# Patient Record
Sex: Male | Born: 1990 | Race: White | Hispanic: No | Marital: Single | Smoking: Never smoker
Health system: Southern US, Community
[De-identification: ages and names within clinical notes are randomized; demographics above are authoritative.]

---

## 2016-04-29 ENCOUNTER — Ambulatory Visit
Admission: RE | Admit: 2016-04-29 | Discharge: 2016-04-29 | Disposition: A | Discharge status: Home / Self Care | Payer: Worker's Compensation | Source: Ambulatory Visit | Attending: Occupational Medicine | Admitting: Occupational Medicine

## 2016-04-29 ENCOUNTER — Other Ambulatory Visit: Payer: Self-pay | Admitting: Occupational Medicine

## 2016-04-29 DIAGNOSIS — S6992XD Unspecified injury of left wrist, hand and finger(s), subsequent encounter: Secondary | ICD-10-CM

## 2016-05-17 ENCOUNTER — Encounter (INDEPENDENT_AMBULATORY_CARE_PROVIDER_SITE_OTHER): Payer: Self-pay | Admitting: Orthopaedic Surgery

## 2016-05-17 ENCOUNTER — Ambulatory Visit (INDEPENDENT_AMBULATORY_CARE_PROVIDER_SITE_OTHER): Payer: Worker's Compensation | Admitting: Orthopaedic Surgery

## 2016-05-17 ENCOUNTER — Ambulatory Visit (INDEPENDENT_AMBULATORY_CARE_PROVIDER_SITE_OTHER): Payer: Self-pay

## 2016-05-17 VITALS — BP 114/71 | HR 90 | Ht 69.0 in | Wt 181.0 lb

## 2016-05-17 DIAGNOSIS — S62337A Displaced fracture of neck of fifth metacarpal bone, left hand, initial encounter for closed fracture: Secondary | ICD-10-CM | POA: Diagnosis not present

## 2016-05-17 DIAGNOSIS — M79642 Pain in left hand: Secondary | ICD-10-CM

## 2016-05-17 NOTE — Progress Notes (Signed)
Office Visit Note   Patient: Sean Bradley           Date of Birth: 10/10/1990           MRN: [id number] Visit Date: 05/17/2016              Requested by: No referring provider defined for this encounter. PCP: No primary care provider on file.   Assessment & Plan: Visit Diagnoses:  1. Pain in left hand           On-the-job injury 04/29/2016 with acute closed left fifth metacarpal neck fracture.  Plan: Reduction was performed with the [facility] brace application. He'll gradually tightness up to improved position. Works of given no work 3 weeks we will recheck him again in 3 weeks. 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  Follow-Up Instructions: No Follow-up on file.   Orders:  Orders Placed This Encounter  Procedures  . XR Hand Complete Left  . XR Hand Complete Left   No orders of the defined types were placed in this encounter.     Procedures: No procedures performed   Clinical Data: No additional findings.   Subjective: Chief Complaint  Patient presents with  . Left Hand - Fracture    Silus is for left minimally displaced metacarpal fracture.  He states that he was at work and a box fell off the box he was trying to get and he went to catch the box and hit his hand.  He is not wearing any type of brace or splint on it today.   Date on-the-job injury is 04/29/2016 he works for "the agency"  Review of Systems  Constitutional: Negative for chills and diaphoresis.  HENT: Negative for ear discharge, ear pain and nosebleeds.   Eyes: Negative for discharge and visual disturbance.  Respiratory: Negative for cough, choking and shortness of breath.   Cardiovascular: Negative for chest pain and palpitations.  Gastrointestinal: Negative for abdominal distention and abdominal pain.  Endocrine: Negative for cold intolerance and heat intolerance.  Genitourinary: Negative  for flank pain and hematuria.  Musculoskeletal:       Recent injuries opposite right hand fingertip. He states he is just getting over that when he had the on-the-job injury to his left hand when a box fell on his hand with this the boxer's fracture.  Skin: Negative for rash and wound.  Neurological: Negative for seizures and speech difficulty.  Hematological: Negative for adenopathy. Does not bruise/bleed easily.  Psychiatric/Behavioral: Negative for agitation and suicidal ideas.     Objective: Vital Signs: BP 114/71 (BP Location: Left Arm, Patient Position: Sitting)   Pulse 90   Ht 5\' 9"  (1.753 m)   Wt 181 lb (82.1 kg)   BMI 26.73 kg/m   Physical Exam  Constitutional: He is oriented to person, place, and time. He appears well-developed and well-nourished.  HENT:  Head: Normocephalic and atraumatic.  Eyes: EOM are normal. Pupils are equal, round, and reactive to light.  Neck: No tracheal deviation present. No thyromegaly present.  Cardiovascular: Normal rate.   Pulmonary/Chest: Effort normal. He has no wheezes.  Abdominal: Soft. Bowel sounds are normal.  Musculoskeletal:  Patient skin is intact. Date of injury was 04/29/2016. There is some mild swelling without rotational deformity of the fifth finger left hand.  Neurological: He is alert and oriented to person, place, and time.  Skin: Skin  is warm and dry. Capillary refill takes less than 2 seconds.  Psychiatric: He has a normal mood and affect. His behavior is normal. Judgment and thought content normal.    Ortho Exam sensation is intact for fundi Settlemyer active. Of lack of prominence of the fifth metacarpal head due to the fifth metacarpal neck fracture.  Specialty Comments:  No specialty comments available.  Imaging: Xr Hand Complete Left  Result Date: 05/17/2016 3 views left hand postreduction fifth metacarpal fracture with [place] brace application is reviewed improved position alignment. Impression:  postreduction left fifth metacarpal neck fracture with improved position  Xr Hand Complete Left  Result Date: 05/17/2016 Three-view x-rays left hand demonstrates the fifth metacarpal neck fracture with 20 angulation. All other metacarpals are intact. Impression: Left fifth metacarpal neck fracture with the mild angulation.    PMFS History: There are no active problems to display for this patient.  No past medical history on file.  No family history on file.  No past surgical history on file. Social History   Occupational History  . Not on file.   Social History Main Topics  . Smoking status: Never Smoker  . Smokeless tobacco: Never Used  . Alcohol use Not on file  . Drug use: Unknown  . Sexual activity: Not on file

## 2016-06-11 ENCOUNTER — Ambulatory Visit (INDEPENDENT_AMBULATORY_CARE_PROVIDER_SITE_OTHER): Payer: Worker's Compensation | Admitting: Orthopaedic Surgery

## 2016-06-11 ENCOUNTER — Ambulatory Visit (INDEPENDENT_AMBULATORY_CARE_PROVIDER_SITE_OTHER): Payer: Self-pay

## 2016-06-11 ENCOUNTER — Encounter (INDEPENDENT_AMBULATORY_CARE_PROVIDER_SITE_OTHER): Payer: Self-pay | Admitting: Orthopaedic Surgery

## 2016-06-11 VITALS — BP 109/68 | HR 59 | Ht 69.0 in | Wt 181.0 lb

## 2016-06-11 DIAGNOSIS — S6292XD Unspecified fracture of left wrist and hand, subsequent encounter for fracture with routine healing: Secondary | ICD-10-CM

## 2016-06-11 DIAGNOSIS — S62337D Displaced fracture of neck of fifth metacarpal bone, left hand, subsequent encounter for fracture with routine healing: Secondary | ICD-10-CM

## 2016-06-11 DIAGNOSIS — S62636A Displaced fracture of distal phalanx of right little finger, initial encounter for closed fracture: Secondary | ICD-10-CM

## 2016-06-11 DIAGNOSIS — S6292XA Unspecified fracture of left wrist and hand, initial encounter for closed fracture: Secondary | ICD-10-CM | POA: Insufficient documentation

## 2016-06-11 DIAGNOSIS — M79644 Pain in right finger(s): Secondary | ICD-10-CM | POA: Insufficient documentation

## 2016-06-11 NOTE — Progress Notes (Signed)
Office Visit Note   Patient: Sean Bradley           Date of Birth: [birth year]           MRN: [id number] Visit Date: 06/11/2016              Requested by: No referring provider defined for this encounter. PCP: No primary care provider on file.   Assessment & Plan: Visit Diagnoses:  1. Closed fracture of left hand with routine healing, subsequent encounter   2. Pain in finger of right hand   3. Displaced fracture of distal phalanx of right little finger, initial encounter for closed fracture            With the on-the-job injury 04/24/2016 right distal phalanx comminuted fracture in good position.  Plan: He remains out of work until 06/25/2016. Work slip given he can resume work at that time. I plan to see him in one month and was assigned him an impairment rating at that time. No immobilization is required for the right fifth finger distal phalanx fracture which is healing and is in good position. He'll return in one month for impairment rating.  Follow-Up Instructions: Return in about 1 month (around 07/12/2016).   Orders:  Orders Placed This Encounter  Procedures  . XR Hand Complete Left  . XR Finger Little Right   No orders of the defined types were placed in this encounter.     Procedures: No procedures performed   Clinical Data: No additional findings.   Subjective: Chief Complaint  Patient presents with  . Left Hand - Fracture  . Right Hand - Fracture    Patient returns for three week follow up left 5th metacarpal neck fracture. He had an on the job injury on 04/29/2016.  He was put in a [place] brace at his last visit. He is out of work.    On a side note, patient mentions that he also fractured right pinky on 04/24/16. He states that he has approval to be seen for both here.     Review of Systems updated and is unchanged from 05/17/2016 other than the right small finger injury where he smashed it with a wooden pallet. This was an on-the-job injury as  well.   Objective: Vital Signs: BP 109/68   Pulse (!) 59   Ht 5\' 9"  (1.753 m)   Wt 181 lb (82.1 kg)   BMI 26.73 kg/m   Physical Exam  Constitutional: He is oriented to person, place, and time. He appears well-developed and well-nourished.  HENT:  Head: Normocephalic and atraumatic.  Eyes: EOM are normal. Pupils are equal, round, and reactive to light.  Neck: No tracheal deviation present. No thyromegaly present.  Cardiovascular: Normal rate.   Pulmonary/Chest: Effort normal. He has no wheezes.  Abdominal: Soft. Bowel sounds are normal.  Musculoskeletal:  Skin of the dorsum of the left hand is normal patient's been wearing his [place]. No rotational deformity. He notices some stiffness the fifth finger. Right finger distal phalanx shows nailbed contusion without step-off of the nail plate. Sensation the fingertip is intact he is able to flex the distal palmar crease collateral ligaments and the DIP joint are stable.  Neurological: He is alert and oriented to person, place, and time.  Skin: Skin is warm and dry. Capillary refill takes less than 2 seconds.  Psychiatric: He has a normal mood and affect. His behavior is normal. Judgment and thought content normal.    Ortho Exam patient  is not tender at the left fifth metacarpal fracture. He lacks the 7 mm touching distal palmar crease and will work on buddy taping and flexion.  Specialty Comments:  No specialty comments available.  Imaging: Xr Finger Little Right  Result Date: 06/11/2016 Three-view x-rays right small finger obtained. This shows a comminuted fracture the distal phalanx extra-articular a transverse component with an oblique longitudinal component. Fracture is anatomic position and there some interval healing. Impression fracture distal phalanx without the rotational or angular deformity. Fracture is comminuted.  Xr Hand Complete Left  Result Date: 06/11/2016 Three-view x-rays left hand obtained this shows the the  fifth metacarpal neck fracture with callus formation interval healing. Impression: Fifth metacarpal fracture involving the neck with the interval healing and improved the position and alignment.    PMFS History: Patient Active Problem List   Diagnosis Date Noted  . Closed left hand fracture 06/11/2016  . Pain in finger of right hand 06/11/2016  . Displaced fracture of distal phalanx of right little finger, initial encounter for closed fracture 06/11/2016   No past medical history on file.  No family history on file.  No past surgical history on file. Social History   Occupational History  . Not on file.   Social History Main Topics  . Smoking status: Never Smoker  . Smokeless tobacco: Never Used  . Alcohol use Not on file  . Drug use: Unknown  . Sexual activity: Not on file

## 2016-07-09 ENCOUNTER — Ambulatory Visit (INDEPENDENT_AMBULATORY_CARE_PROVIDER_SITE_OTHER): Payer: Worker's Compensation | Admitting: Orthopaedic Surgery

## 2017-05-07 IMAGING — CR DG HAND COMPLETE 3+V*L*
3 series · 3 of 3 positions shown · non-contrast
Comparison: None.

CLINICAL DATA: Injury

EXAM:
LEFT HAND - COMPLETE 3+ VIEW

[x hand pa left]
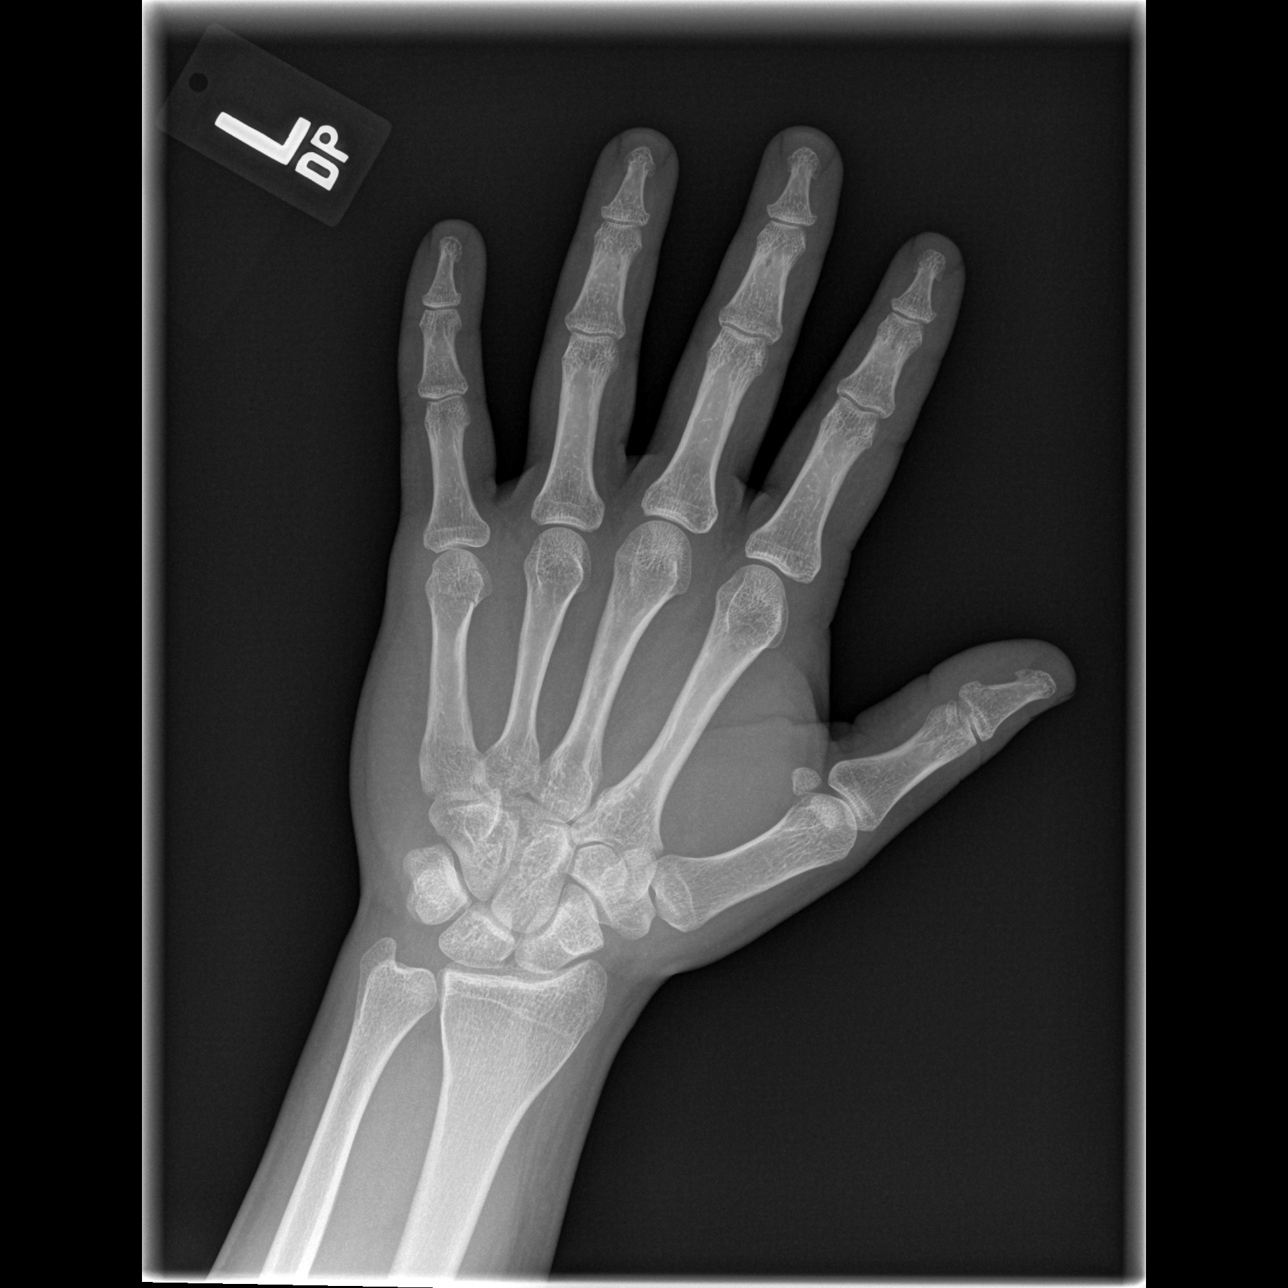

[x hand oblique left]
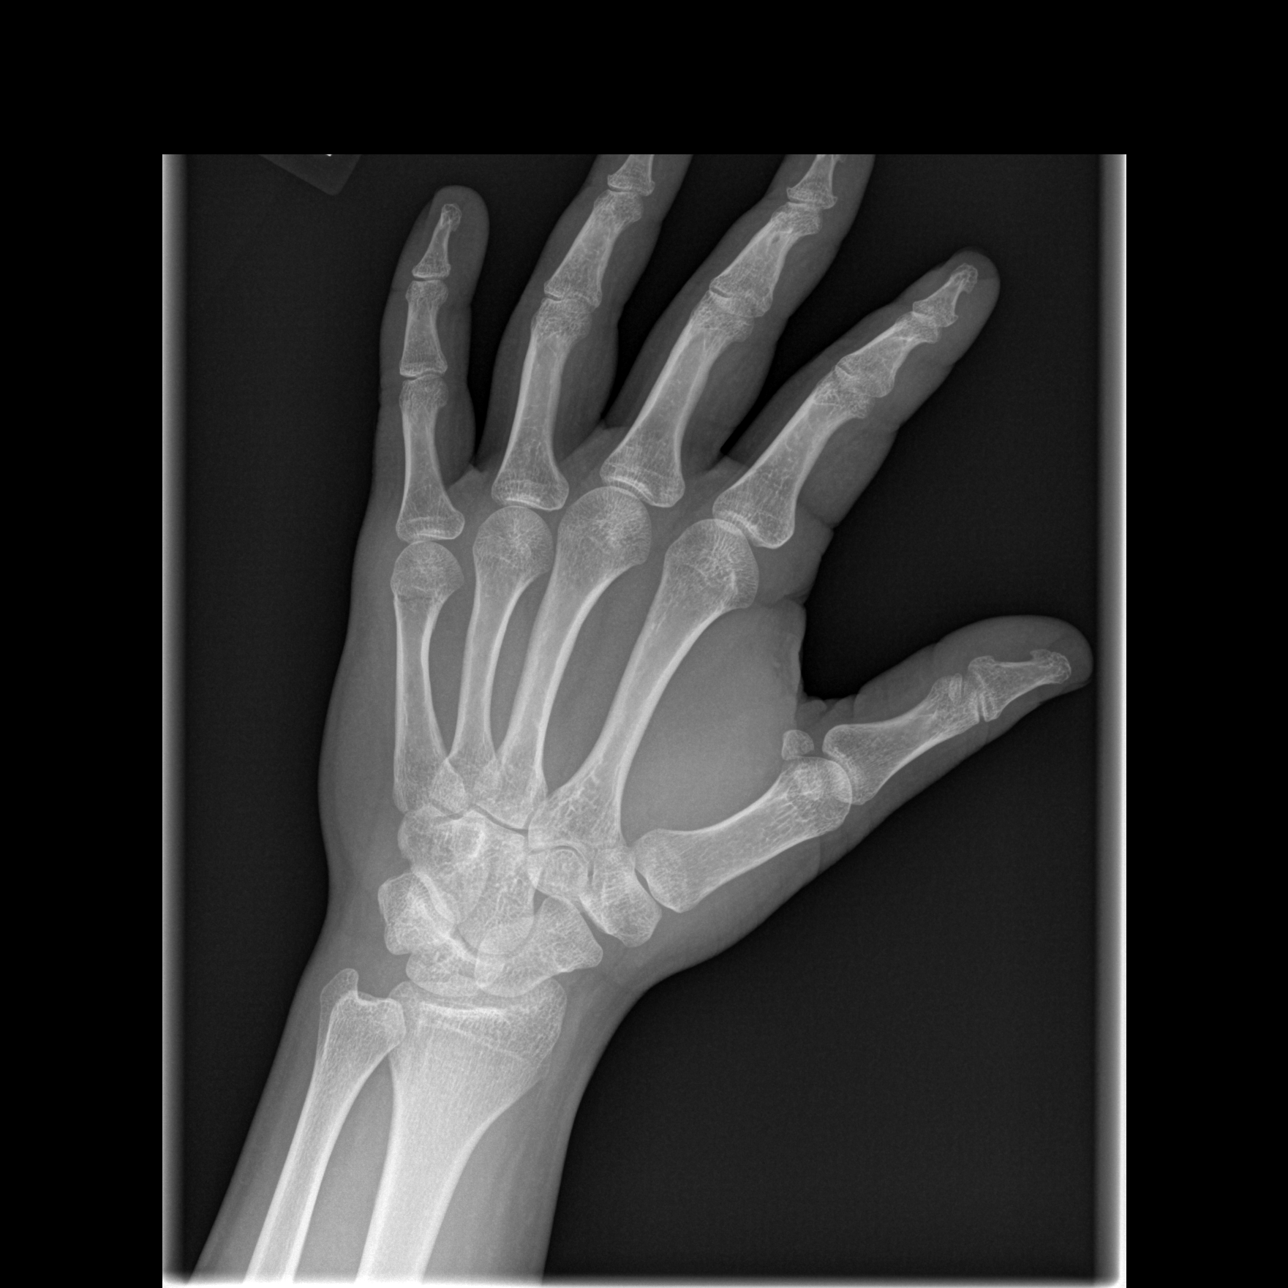

[x hand lat left]
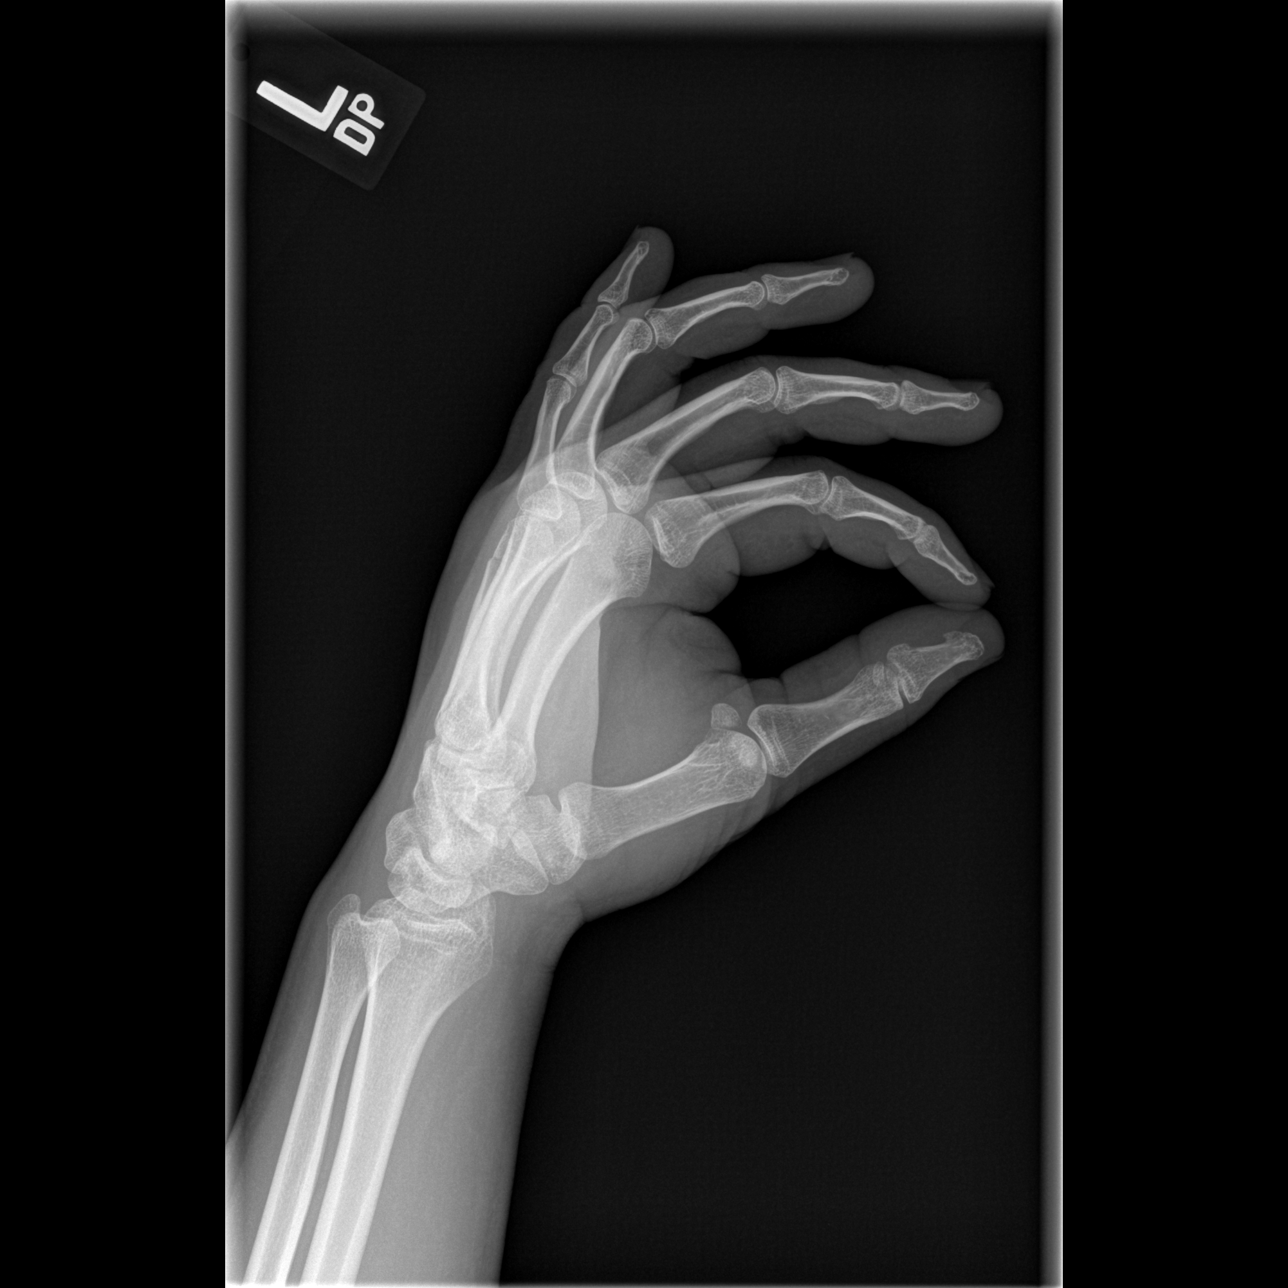

[3 of 3 positions shown; findings below may reference images not displayed]

FINDINGS: There is a minimally displaced fracture through the distal
metaphysis of the fifth metacarpal. There is mild soft tissue
swelling associated with the fracture. Remainder of the bony
framework is intact.
IMPRESSION: Acute minimally displaced fracture of the distal fifth metacarpal.
